# Patient Record
Sex: Male | Born: 1955 | Race: Black or African American | Hispanic: No | Marital: Married | Smoking: Never smoker
Health system: Southern US, Community
[De-identification: ages and names within clinical notes are randomized; demographics above are authoritative.]

## PROBLEM LIST (undated history)

## (undated) DIAGNOSIS — E119 Type 2 diabetes mellitus without complications: Secondary | ICD-10-CM

## (undated) DIAGNOSIS — I10 Essential (primary) hypertension: Secondary | ICD-10-CM

## (undated) DIAGNOSIS — I639 Cerebral infarction, unspecified: Secondary | ICD-10-CM

## (undated) DIAGNOSIS — I4891 Unspecified atrial fibrillation: Secondary | ICD-10-CM

## (undated) DIAGNOSIS — M109 Gout, unspecified: Secondary | ICD-10-CM

## (undated) DIAGNOSIS — Z992 Dependence on renal dialysis: Secondary | ICD-10-CM

## (undated) HISTORY — PX: PERITONEAL CATHETER INSERTION: SHX2223

---

## 2009-01-14 ENCOUNTER — Ambulatory Visit (HOSPITAL_COMMUNITY): Admission: RE | Admit: 2009-01-14 | Discharge: 2009-01-14 | Payer: Self-pay | Admitting: Cardiovascular Disease

## 2009-07-14 ENCOUNTER — Emergency Department (HOSPITAL_BASED_OUTPATIENT_CLINIC_OR_DEPARTMENT_OTHER): Admission: EM | Admit: 2009-07-14 | Discharge: 2009-07-14 | Payer: Self-pay | Admitting: Emergency Medicine

## 2010-12-22 ENCOUNTER — Emergency Department (HOSPITAL_BASED_OUTPATIENT_CLINIC_OR_DEPARTMENT_OTHER)
Admission: EM | Admit: 2010-12-22 | Discharge: 2010-12-22 | Disposition: A | Discharge status: Nursing Home (Includes ICF) | Payer: Self-pay | Attending: Emergency Medicine | Admitting: Emergency Medicine

## 2010-12-22 ENCOUNTER — Emergency Department (INDEPENDENT_AMBULATORY_CARE_PROVIDER_SITE_OTHER): Payer: Self-pay

## 2010-12-22 ENCOUNTER — Emergency Department (HOSPITAL_BASED_OUTPATIENT_CLINIC_OR_DEPARTMENT_OTHER): Payer: Self-pay

## 2010-12-22 DIAGNOSIS — I10 Essential (primary) hypertension: Secondary | ICD-10-CM | POA: Insufficient documentation

## 2010-12-22 DIAGNOSIS — R079 Chest pain, unspecified: Secondary | ICD-10-CM | POA: Insufficient documentation

## 2010-12-22 DIAGNOSIS — E7800 Pure hypercholesterolemia, unspecified: Secondary | ICD-10-CM | POA: Insufficient documentation

## 2010-12-22 DIAGNOSIS — I509 Heart failure, unspecified: Secondary | ICD-10-CM | POA: Insufficient documentation

## 2010-12-22 DIAGNOSIS — I82629 Acute embolism and thrombosis of deep veins of unspecified upper extremity: Secondary | ICD-10-CM

## 2010-12-22 DIAGNOSIS — M7989 Other specified soft tissue disorders: Secondary | ICD-10-CM | POA: Insufficient documentation

## 2010-12-22 DIAGNOSIS — E119 Type 2 diabetes mellitus without complications: Secondary | ICD-10-CM | POA: Insufficient documentation

## 2010-12-22 DIAGNOSIS — Z79899 Other long term (current) drug therapy: Secondary | ICD-10-CM | POA: Insufficient documentation

## 2010-12-22 DIAGNOSIS — M25519 Pain in unspecified shoulder: Secondary | ICD-10-CM | POA: Insufficient documentation

## 2010-12-22 DIAGNOSIS — Z8739 Personal history of other diseases of the musculoskeletal system and connective tissue: Secondary | ICD-10-CM | POA: Insufficient documentation

## 2010-12-22 DIAGNOSIS — M542 Cervicalgia: Secondary | ICD-10-CM | POA: Insufficient documentation

## 2010-12-22 LAB — DIFFERENTIAL
Basophils Absolute: 0 10*3/uL (ref 0.0–0.1)
Eosinophils Absolute: 0.2 10*3/uL (ref 0.0–0.7)
Eosinophils Relative: 2 % (ref 0–5)
Lymphocytes Relative: 15 % (ref 12–46)
Monocytes Absolute: 0.8 10*3/uL (ref 0.1–1.0)

## 2010-12-22 LAB — APTT: aPTT: 37 seconds (ref 24–37)

## 2010-12-22 LAB — CBC
HCT: 29 % — ABNORMAL LOW (ref 39.0–52.0)
MCHC: 33.1 g/dL (ref 30.0–36.0)
MCV: 86.1 fL (ref 78.0–100.0)
Platelets: 212 10*3/uL (ref 150–400)
RDW: 15.1 % (ref 11.5–15.5)
WBC: 9.3 10*3/uL (ref 4.0–10.5)

## 2010-12-22 LAB — BASIC METABOLIC PANEL
CO2: 21 mEq/L (ref 19–32)
Calcium: 8.4 mg/dL (ref 8.4–10.5)
Creatinine, Ser: 2.2 mg/dL — ABNORMAL HIGH (ref 0.4–1.5)
GFR calc Af Amer: 38 mL/min — ABNORMAL LOW (ref >60)
GFR calc non Af Amer: 31 mL/min — ABNORMAL LOW (ref >60)
Glucose, Bld: 380 mg/dL — ABNORMAL HIGH (ref 70–99)

## 2010-12-22 LAB — HEMOCCULT GUIAC POC 1CARD (OFFICE): Fecal Occult Bld: NEGATIVE

## 2010-12-22 LAB — PROTIME-INR: Prothrombin Time: 14.2 seconds (ref 11.6–15.2)

## 2011-01-12 LAB — BASIC METABOLIC PANEL
Calcium: 9.1 mg/dL (ref 8.4–10.5)
Creatinine, Ser: 2.24 mg/dL — ABNORMAL HIGH (ref 0.4–1.5)
GFR calc Af Amer: 38 mL/min — ABNORMAL LOW (ref >60)
GFR calc non Af Amer: 31 mL/min — ABNORMAL LOW (ref >60)
Sodium: 138 mEq/L (ref 135–145)

## 2011-01-12 LAB — GLUCOSE, CAPILLARY
Glucose-Capillary: 162 mg/dL — ABNORMAL HIGH (ref 70–99)
Glucose-Capillary: 176 mg/dL — ABNORMAL HIGH (ref 70–99)

## 2011-02-15 NOTE — Cardiovascular Report (Signed)
Peter Smith, CABINESS NO.:  [id number]   MEDICAL RECORD NO.:  [id number]          PATIENT TYPE:  OIB   LOCATION:  2899                         FACILITY:  [facility]   PHYSICIAN:  Nanetta Batty, M.D.   DATE OF BIRTH:  21-Mar-1956   DATE OF PROCEDURE:  DATE OF DISCHARGE:  01/14/2009                            CARDIAC CATHETERIZATION   Mr. Oblinger is a 55 year old mildly overweight African American male with  history of hypertension, diabetes, and hyperlipidemia.  Seen by Dr.  Konrad Felix for cardiovascular evaluation after complaining of an episode of  chest pain while attending a sporting event in [place].  Myoview stress  test showed EF in the 34% range with anteroapical scar and peri-infarct  ischemia.  He does have moderate renal insufficiency.  His lisinopril,  hydrochlorothiazide, and furosemide were held on the day prior to the  procedure.  The patient was referred for diagnostic coronary  arteriography to define his anatomy and rule out ischemic etiology.   His beginning creatinine today was 2.24.  He received Mucomyst and  bicarb protocol.  He received 5 mg of Valium p.o.  His right groin was  prepped and draped in the usual sterile fashion.  Xylocaine 1% was used  for local anesthesia.  A 6-French sheath was inserted into the right  femoral artery using standard Seldinger technique.  A 6-French right and  left Judkins diagnostic catheters along with a 6-French pigtail catheter  were used for selective coronary angiography and left ventriculography  respectively.  His LVEDP was approximately 35.  Visipaque dye was used  for the entirety of the case.  Retrograde aorta, left ventricular, and  pullback pressures were recorded.   HEMODYNAMICS:  1. Aortic systolic pressure 189, diastolic pressure 103.  2. Left ventricular systolic pressure 190.  End-diastolic pressure 35.   SELECTIVE CORONARY ANGIOGRAPHY:  1. Left main normal.  2. LAD; the LAD was a large vessel that  wrapped the apex.  It gave off      a first diagonal branch which was at least moderate in size and was      occluded in this portion.  3. Left circumflex; nondominant with a 40-50% mid stenosis.  4. Ramus branch; small to medium in size and free of significant      disease.  5. Right coronary artery; dominant with lumpy bumpy disease but      nothing greater than 20-30%.  6. Left ventriculography; RAO, left ventriculogram WAS performed using      25 mL of Visipaque dye at 12 mL per second.  The overall LVEF was      estimated at approximately 35-40% with moderate global hypokinesia.   IMPRESSION:  Mr. Ham has an occluded diagonal, which probably  accounts for his Myoview abnormality.  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  Continued medical  therapy will be recommended.  The sheath was removed and pressure was  held in the groin to achieve hemostasis.  The patient left Lab in stable  condition.  He will finish his bicarb infusion for radiocontrast  nephropathy prophylaxis and will follow up with Dr. Konrad Felix as an  outpatient.  Left Lab in stable condition.      Nanetta Batty, M.D.  Electronically Signed     JB/MEDQ  D:  01/14/2009  T:  01/15/2009  Job:  [id number]   cc:   [facility]  Second Floor [facility] Cardiac Cath Lab  Elwin Mocha, MD/PhD

## 2014-04-04 ENCOUNTER — Emergency Department (HOSPITAL_BASED_OUTPATIENT_CLINIC_OR_DEPARTMENT_OTHER)
Admission: EM | Admit: 2014-04-04 | Discharge: 2014-04-05 | Disposition: A | Discharge status: Other Acute Inpatient Hospital | Payer: Medicare Other | Attending: Emergency Medicine | Admitting: Emergency Medicine

## 2014-04-04 ENCOUNTER — Encounter (HOSPITAL_BASED_OUTPATIENT_CLINIC_OR_DEPARTMENT_OTHER): Payer: Self-pay | Admitting: Emergency Medicine

## 2014-04-04 ENCOUNTER — Emergency Department (HOSPITAL_BASED_OUTPATIENT_CLINIC_OR_DEPARTMENT_OTHER): Payer: Medicare Other

## 2014-04-04 DIAGNOSIS — R05 Cough: Secondary | ICD-10-CM | POA: Insufficient documentation

## 2014-04-04 DIAGNOSIS — L03119 Cellulitis of unspecified part of limb: Principal | ICD-10-CM

## 2014-04-04 DIAGNOSIS — R059 Cough, unspecified: Secondary | ICD-10-CM | POA: Insufficient documentation

## 2014-04-04 DIAGNOSIS — Z992 Dependence on renal dialysis: Secondary | ICD-10-CM | POA: Insufficient documentation

## 2014-04-04 DIAGNOSIS — M109 Gout, unspecified: Secondary | ICD-10-CM | POA: Insufficient documentation

## 2014-04-04 DIAGNOSIS — Z7901 Long term (current) use of anticoagulants: Secondary | ICD-10-CM | POA: Insufficient documentation

## 2014-04-04 DIAGNOSIS — E119 Type 2 diabetes mellitus without complications: Secondary | ICD-10-CM | POA: Insufficient documentation

## 2014-04-04 DIAGNOSIS — I10 Essential (primary) hypertension: Secondary | ICD-10-CM | POA: Insufficient documentation

## 2014-04-04 DIAGNOSIS — L089 Local infection of the skin and subcutaneous tissue, unspecified: Secondary | ICD-10-CM

## 2014-04-04 DIAGNOSIS — L02619 Cutaneous abscess of unspecified foot: Secondary | ICD-10-CM | POA: Insufficient documentation

## 2014-04-04 HISTORY — DX: Type 2 diabetes mellitus without complications: E11.9

## 2014-04-04 HISTORY — DX: Essential (primary) hypertension: I10

## 2014-04-04 HISTORY — DX: Unspecified atrial fibrillation: I48.91

## 2014-04-04 HISTORY — DX: Gout, unspecified: M10.9

## 2014-04-04 HISTORY — DX: Dependence on renal dialysis: Z99.2

## 2014-04-04 HISTORY — DX: Cerebral infarction, unspecified: I63.9

## 2014-04-04 LAB — CBC WITH DIFFERENTIAL/PLATELET
BASOS ABS: 0 10*3/uL (ref 0.0–0.1)
BASOS PCT: 0 % (ref 0–1)
Eosinophils Absolute: 0.4 10*3/uL (ref 0.0–0.7)
Eosinophils Relative: 3 % (ref 0–5)
HCT: 27.6 % — ABNORMAL LOW (ref 39.0–52.0)
Hemoglobin: 9 g/dL — ABNORMAL LOW (ref 13.0–17.0)
Lymphocytes Relative: 12 % (ref 12–46)
Lymphs Abs: 1.5 10*3/uL (ref 0.7–4.0)
MCH: 28 pg (ref 26.0–34.0)
MCHC: 32.6 g/dL (ref 30.0–36.0)
MCV: 85.7 fL (ref 78.0–100.0)
MONOS PCT: 11 % (ref 3–12)
Monocytes Absolute: 1.4 10*3/uL — ABNORMAL HIGH (ref 0.1–1.0)
NEUTROS PCT: 75 % (ref 43–77)
Neutro Abs: 9.9 10*3/uL — ABNORMAL HIGH (ref 1.7–7.7)
Platelets: 216 10*3/uL (ref 150–400)
RBC: 3.22 MIL/uL — ABNORMAL LOW (ref 4.22–5.81)
RDW: 15.7 % — AB (ref 11.5–15.5)
WBC: 13.3 10*3/uL — ABNORMAL HIGH (ref 4.0–10.5)

## 2014-04-04 LAB — COMPREHENSIVE METABOLIC PANEL
ALT: 9 U/L (ref 0–53)
AST: 12 U/L (ref 0–37)
Albumin: 2.6 g/dL — ABNORMAL LOW (ref 3.5–5.2)
Alkaline Phosphatase: 147 U/L — ABNORMAL HIGH (ref 39–117)
Anion gap: 24 — ABNORMAL HIGH (ref 5–15)
BILIRUBIN TOTAL: 0.2 mg/dL — AB (ref 0.3–1.2)
BUN: 64 mg/dL — ABNORMAL HIGH (ref 6–23)
CO2: 23 meq/L (ref 19–32)
Calcium: 9.1 mg/dL (ref 8.4–10.5)
Chloride: 86 mEq/L — ABNORMAL LOW (ref 96–112)
Creatinine, Ser: 15.6 mg/dL — ABNORMAL HIGH (ref 0.50–1.35)
GFR calc Af Amer: 3 mL/min — ABNORMAL LOW (ref >90)
GFR calc non Af Amer: 3 mL/min — ABNORMAL LOW (ref >90)
Glucose, Bld: 111 mg/dL — ABNORMAL HIGH (ref 70–99)
Potassium: 4.8 mEq/L (ref 3.7–5.3)
SODIUM: 133 meq/L — AB (ref 137–147)
Total Protein: 7.4 g/dL (ref 6.0–8.3)

## 2014-04-04 LAB — CBG MONITORING, ED: GLUCOSE-CAPILLARY: 103 mg/dL — AB (ref 70–99)

## 2014-04-04 LAB — PROTIME-INR
INR: 4.37 — ABNORMAL HIGH (ref 0.00–1.49)
Prothrombin Time: 41.8 seconds — ABNORMAL HIGH (ref 11.6–15.2)

## 2014-04-04 MED ORDER — HYDROMORPHONE HCL PF 1 MG/ML IJ SOLN
1.0000 mg | Freq: Once | INTRAMUSCULAR | Status: AC
  Administered 2014-04-04: 1 mg via INTRAVENOUS
  Filled 2014-04-04: qty 1

## 2014-04-04 MED ORDER — ONDANSETRON HCL 4 MG/2ML IJ SOLN
4.0000 mg | Freq: Once | INTRAMUSCULAR | Status: AC
  Administered 2014-04-04: 4 mg via INTRAVENOUS
  Filled 2014-04-04: qty 2

## 2014-04-04 MED ORDER — SODIUM CHLORIDE 0.9 % IV SOLN
INTRAVENOUS | Status: DC
  Administered 2014-04-04: 23:00:00 via INTRAVENOUS

## 2014-04-04 MED ORDER — VANCOMYCIN HCL IN DEXTROSE 1-5 GM/200ML-% IV SOLN
1000.0000 mg | Freq: Once | INTRAVENOUS | Status: AC
  Administered 2014-04-04: 1000 mg via INTRAVENOUS
  Filled 2014-04-04: qty 200

## 2014-04-04 NOTE — ED Notes (Signed)
Ankle and feet have been swelling for a week. He has noticed blood coming from his right foot and an odor. He is diabetic and a peritoneal dialysis pt.

## 2014-04-04 NOTE — ED Provider Notes (Signed)
CSN: [id number]     Arrival date & time 04/04/14  1840 History  This chart was scribed for Peter MuldersScott Merlean Pizzini, MD by Evon Slackerrance Branch, ED Scribe. This patient was seen in room MH10/MH10 and the patient's care was started at 9:23 PM.     Chief Complaint  Patient presents with  . Leg Swelling   The history is provided by the patient. No language interpreter was used.   HPI Comments: Peter Smith is a 58 y.o. male who presents to the Emergency Department complaining of right foot pain onset 2 days. He states he has associated chills, cough, leg edema, history of bleeding easily due to Coumadin, and headache. States that when removing his socks he noticed his right foot was also bleeding. He has a history of diabetes. States he check his sugars regularly 240 at night and 140 in the morning. He denies fever, visual disturbance, chest pain, SOB, nausea, vomiting, rhinorrhea, sore throat, neck pain, back pain, or rash.       [facility]   Past Medical History  Diagnosis Date  . Peritoneal dialysis status   . Diabetes mellitus without complication   . Hypertension   . Atrial fibrillation   . Gout   . Stroke    Past Surgical History  Procedure Laterality Date  . Peritoneal catheter insertion     No family history on file. History  Substance Use Topics  . Smoking status: Never Smoker   . Smokeless tobacco: Not on file  . Alcohol Use: Yes    Review of Systems  Constitutional: Positive for chills. Negative for fever.  HENT: Negative for rhinorrhea and sore throat.   Eyes: Negative for visual disturbance.  Respiratory: Positive for cough. Negative for shortness of breath.   Cardiovascular: Negative for chest pain.  Gastrointestinal: Negative for nausea, vomiting, abdominal pain and diarrhea.  Genitourinary: Negative for dysuria.  Musculoskeletal: Negative for back pain and neck pain.  Skin: Negative for rash.  Neurological: Positive for headaches.   Psychiatric/Behavioral: Negative for confusion.      Allergies  Lisinopril  Home Medications   Prior to Admission medications   Medication Sig Start Date End Date Taking? Authorizing Provider  atorvastatin (LIPITOR) 20 MG tablet Take 20 mg by mouth daily.   Yes Historical Provider, MD  calcitRIOL (ROCALTROL) 0.25 MCG capsule Take 0.25 mcg by mouth daily.   Yes Historical Provider, MD  cloNIDine (CATAPRES) 0.3 MG tablet Take by mouth 2 (two) times daily.   Yes Historical Provider, MD  colchicine 0.6 MG tablet Take 0.6 mg by mouth daily.   Yes Historical Provider, MD  diltiazem (CARDIZEM) 120 MG tablet Take 120 mg by mouth 4 (four) times daily.   Yes Historical Provider, MD  doxazosin (CARDURA) 2 MG tablet Take 2 mg by mouth daily.   Yes Historical Provider, MD  febuxostat (ULORIC) 40 MG tablet Take 40 mg by mouth daily.   Yes Historical Provider, MD  furosemide (LASIX) 40 MG tablet Take 40 mg by mouth.   Yes Historical Provider, MD  gemfibrozil (LOPID) 600 MG tablet Take 600 mg by mouth 2 (two) times daily before a meal.   Yes Historical Provider, MD  labetalol (NORMODYNE) 300 MG tablet Take 300 mg by mouth 2 (two) times daily.   Yes Historical Provider, MD  magnesium oxide (MAG-OX) 400 MG tablet Take 400 mg by mouth daily.   Yes Historical Provider, MD  Nutritional Supplements (COLD AND FLU) THERAPY PACK MISC Take by  mouth.   Yes Historical Provider, MD  Omega-3-acid Ethyl Esters (LOVAZA PO) Take by mouth.   Yes Historical Provider, MD  Potassium (POTASSIMIN PO) Take by mouth.   Yes Historical Provider, MD  sodium bicarbonate 650 MG tablet Take 650 mg by mouth 4 (four) times daily.   Yes Historical Provider, MD  spironolactone (ALDACTONE) 25 MG tablet Take 25 mg by mouth daily.   Yes Historical Provider, MD  warfarin (COUMADIN) 2 MG tablet Take 2 mg by mouth daily.   Yes Historical Provider, MD   Triage Vitals: BP 132/84  Pulse 75  Temp(Src) 98 F (36.7 C) (Oral)  Resp 16  Ht 6'  (1.829 m)  Wt 255 lb (115.667 kg)  BMI 34.58 kg/m2  SpO2 100%  Physical Exam  Nursing note and vitals reviewed. Constitutional: He is oriented to person, place, and time. He appears well-developed and well-nourished. No distress.  HENT:  Head: Normocephalic and atraumatic.  Eyes: Conjunctivae and EOM are normal.  Neck: Neck supple. No tracheal deviation present.  Cardiovascular: Normal rate, regular rhythm and normal heart sounds.   No murmur heard. Pulmonary/Chest: Effort normal and breath sounds normal. No respiratory distress.  Abdominal: Bowel sounds are normal. There is no tenderness.  peritoneal dialysis catheter on left side of abdomen   Musculoskeletal: Normal range of motion.  1+ pitting edema both leg, 1cm dry ulcer to lateral left foot  Neurological: He is alert and oriented to person, place, and time.  Skin: Skin is warm and dry.  1cm dry ulcer to bottom lateral left foot, Cap refill 2 seconds, second toe of right foot purplish and dark of distal tip  Psychiatric: He has a normal mood and affect. His behavior is normal.    ED Course  Procedures (including critical care time) DIAGNOSTIC STUDIES: Oxygen Saturation is 100% on RA, normal by my interpretation.    COORDINATION OF CARE:    Labs Review Labs Reviewed  PROTIME-INR - Abnormal; Notable for the following:    Prothrombin Time 41.8 (*)    INR 4.37 (*)    All other components within normal limits  CBC WITH DIFFERENTIAL  COMPREHENSIVE METABOLIC PANEL  CBG MONITORING, ED   Results for orders placed during the hospital encounter of 04/04/14  PROTIME-INR      Result Value Ref Range   Prothrombin Time 41.8 (*) 11.6 - 15.2 seconds   INR 4.37 (*) 0.00 - 1.49     Imaging Review Dg Chest 2 View  04/04/2014   CLINICAL DATA:  Right foot pain.  Chills, cough.  EXAM: CHEST  2 VIEW  COMPARISON:  12/22/2010  FINDINGS: Cardiomegaly. No confluent opacities, effusions or edema. No acute bony abnormality.  IMPRESSION:  No active cardiopulmonary disease.   Electronically Signed   By: Charlett Nose M.D.   On: 04/04/2014 22:46   Dg Foot 2 Views Left  04/04/2014   CLINICAL DATA:  Foot pain.  EXAM: LEFT FOOT - 2 VIEW  COMPARISON:  Right foot performed today.  FINDINGS: Mild hallux valgus. Mild degenerative changes in the midfoot. Mild soft tissue swelling over the lateral foot. No underlying acute bony abnormality to suggest osteomyelitis. No fracture, subluxation or dislocation. Diffuse vascular calcifications noted.  IMPRESSION: No acute bony abnormality.   Electronically Signed   By: Charlett Nose M.D.   On: 04/04/2014 22:49   Dg Foot 2 Views Right  04/04/2014   CLINICAL DATA:  Leg swelling, foot pain.  EXAM: RIGHT FOOT - 2 VIEW  COMPARISON:  Left foot performed today.  FINDINGS: Severe hallux valgus. Mild degenerative changes in the midfoot. Possible fracture in the proximal phalanx of the right third toe. The may represent an old injury. Recommend correlation for injury and pain in this area.  Diffuse vascular calcifications.  Soft tissues are intact.  IMPRESSION: Hallux valgus.  Question acute or old fracture within the right third proximal phalanx. Recommend clinical correlation.   Electronically Signed   By: Charlett NoseKevin  Dover M.D.   On: 04/04/2014 22:48     EKG Interpretation   Date/Time:  Friday April 04 2014 22:31:34 EDT Ventricular Rate:  78 PR Interval:  168 QRS Duration: 88 QT Interval:  386 QTC Calculation: 440 R Axis:   8 Text Interpretation:  Normal sinus rhythm Nonspecific ST and T wave  abnormality Abnormal ECG Confirmed by Rosilyn Coachman  MD, Timothy Townsel ([id number]) on  04/04/2014 10:47:18 PM      MDM   Final diagnoses:  Right foot infection    Patient is on Coumadin for atrial for relation. Patient also has diabetes also gets peritoneal dialysis. Patient's INR is elevated 4. This could be responsible for some of the foot bleeding. X-rays of both feet showed no evidence of osteomyelitis or gas. Patient's right  foot around the third and second toe had some bleeding at home. Darkness to those toes. Very well could represent some necrotic changes. Tenderness to both feet. Swelling to both legs. Cap refill is sluggish but present.  Patient treated with vancomycin for possible of cellulitis to the right foot. Patient's labs are pending. Patient may very well require admission due to the findings of the right foot. Chest x-ray negative for evidence of any discharge failure or pulmonary edema. EKG with heart rate of 78 normal sinus rhythm no evidence of atrial fibrillation right now.  Patient requires admission once [facility].    I personally performed the services described in this documentation, which was scribed in my presence. The recorded information has been reviewed and is accurate.       Peter MuldersScott Gavrielle Streck, MD 04/04/14 [phone]

## 2014-04-04 NOTE — ED Notes (Signed)
Pt with swelling bilaterally to LE. Breakdown between toes on right foot. Area about the size of a nickel to bottom of left foot. Pt has noticed a bad odor. Pt with history of diabetes.

## 2014-04-05 NOTE — ED Provider Notes (Signed)
Assumed care from previous EDP. Patient with swollen and tender right second toe. He has elevated white blood cell count at 13,000. Concern for diabetic foot infection. Discussed with hospitalist [facility] and will accept the patient in transfer.   Loren Raceravid Conlee Sliter, MD 04/05/14 [phone]

## 2014-09-25 IMAGING — CR DG CHEST 2V
2 series · 2 of 2 positions shown · non-contrast
Comparison: 12/22/2010

CLINICAL DATA: Right foot pain.  Chills, cough.

EXAM:
CHEST  2 VIEW

[w chest pa]
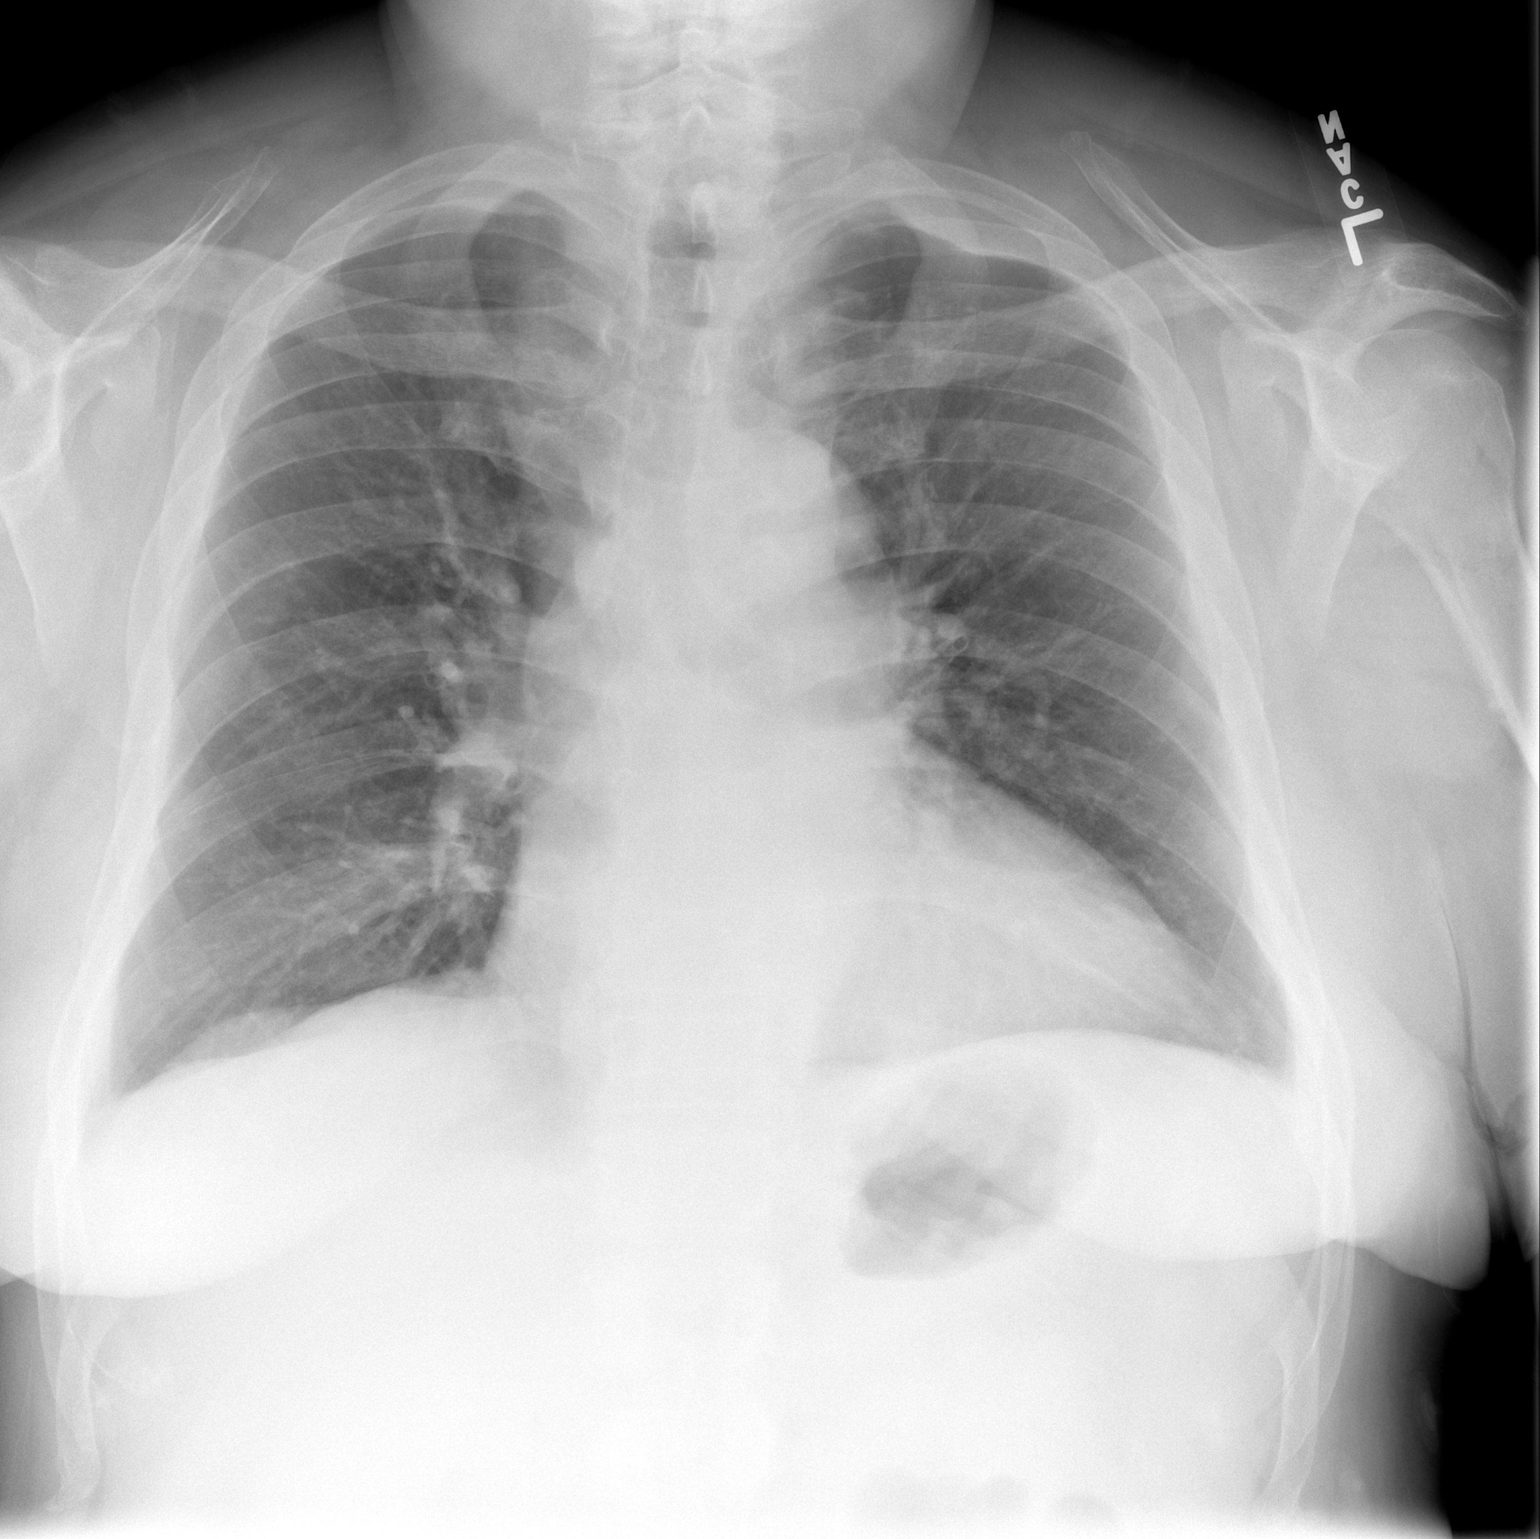

[w chest lat]
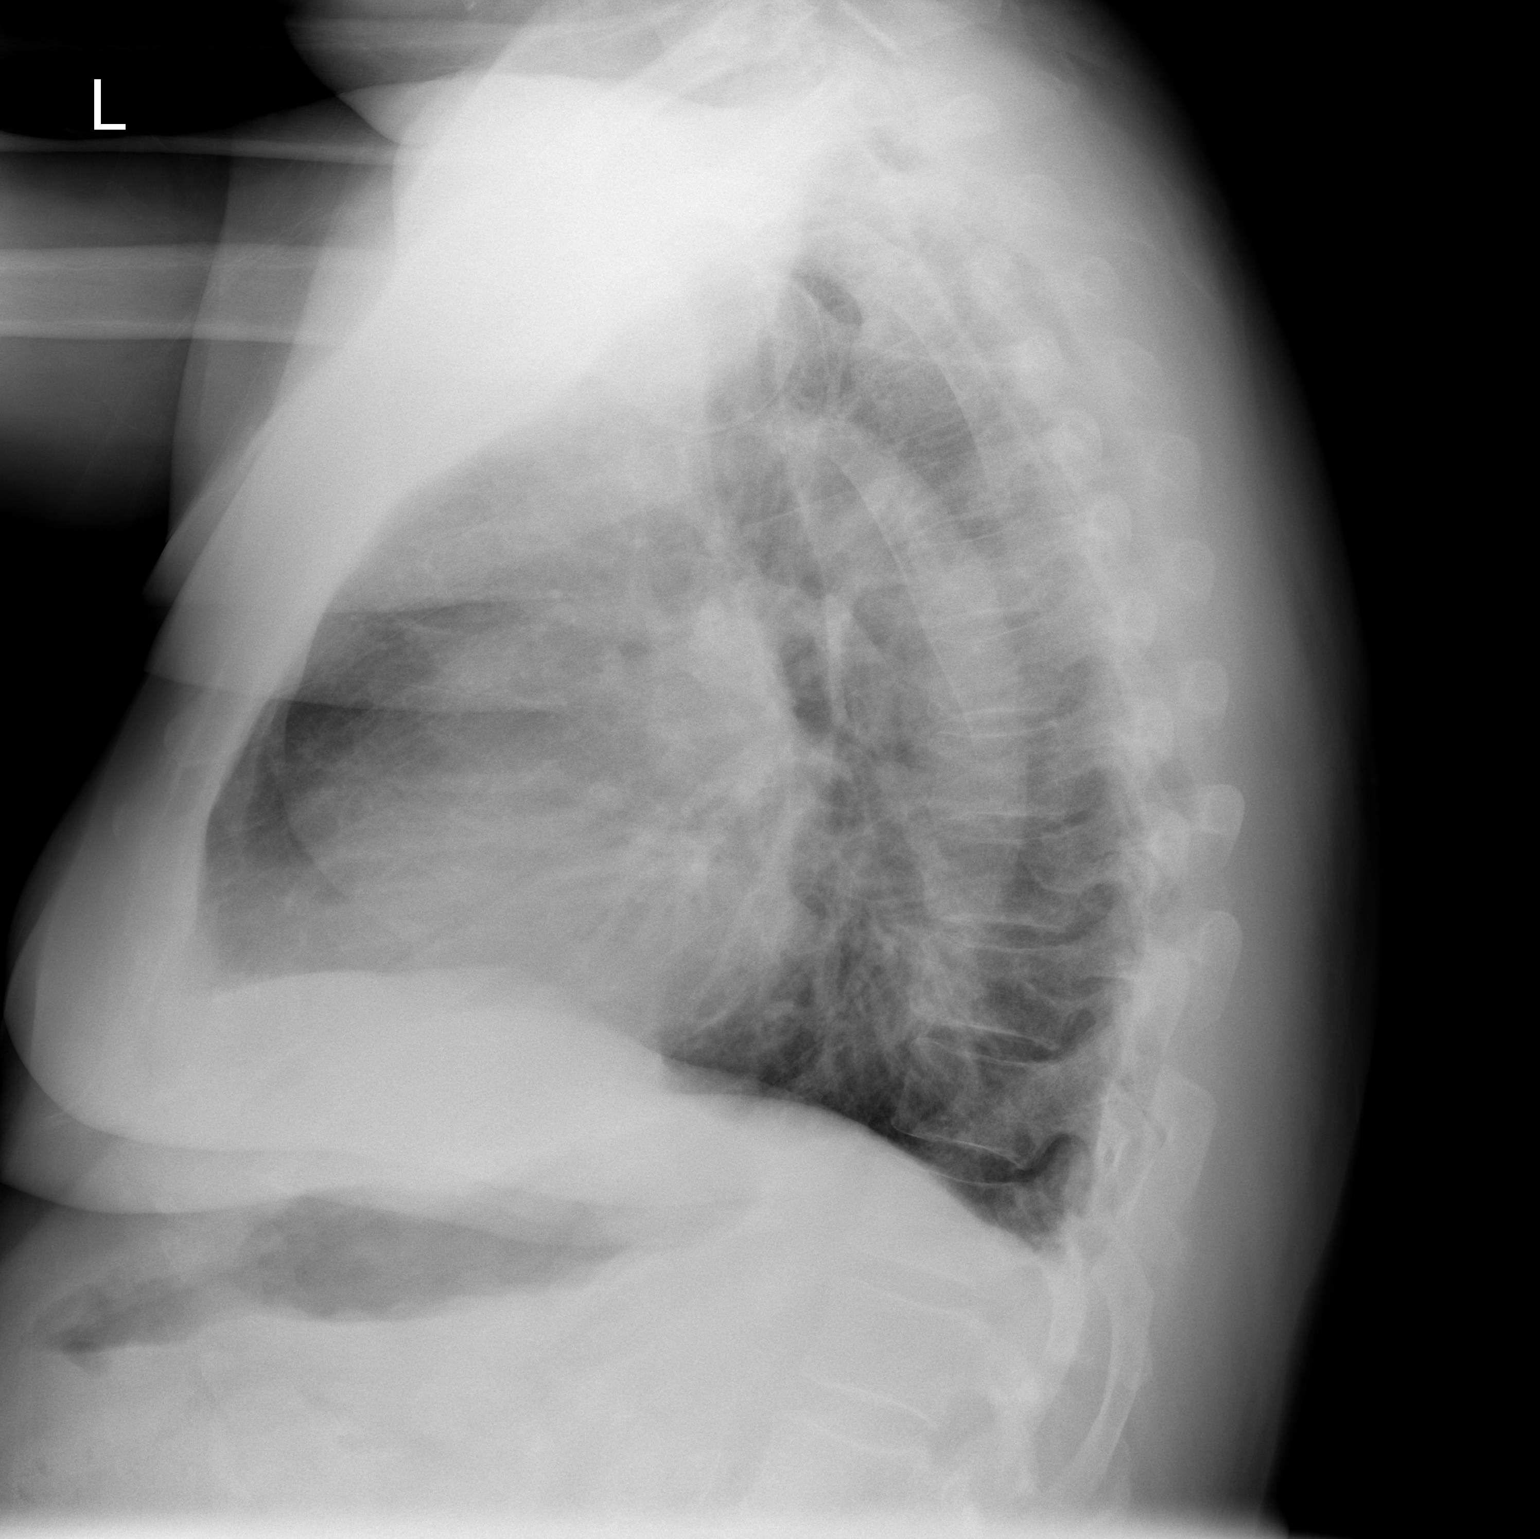

[2 of 2 positions shown; findings below may reference images not displayed]

FINDINGS: Cardiomegaly. No confluent opacities, effusions or edema. No acute
bony abnormality.
IMPRESSION: No active cardiopulmonary disease.

## 2017-03-03 DEATH — deceased
